# Patient Record
Sex: Male | Born: 1990 | Race: White | Hispanic: No | Marital: Married
Health system: Southern US, Community
[De-identification: ages and names within clinical notes are randomized; demographics above are authoritative.]

---
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
## 2014-02-10 ENCOUNTER — Emergency Department: Payer: Self-pay | Admitting: Emergency Medicine

## 2014-03-19 ENCOUNTER — Emergency Department: Payer: Self-pay | Admitting: Emergency Medicine

## 2014-08-16 ENCOUNTER — Emergency Department: Payer: Self-pay | Admitting: Emergency Medicine

## 2016-04-09 ENCOUNTER — Emergency Department
Admission: EM | Admit: 2016-04-09 | Discharge: 2016-04-09 | Disposition: A | Discharge status: Home / Self Care | Payer: Self-pay | Attending: Emergency Medicine | Admitting: Emergency Medicine

## 2016-04-09 ENCOUNTER — Emergency Department: Payer: Self-pay

## 2016-04-09 DIAGNOSIS — S0181XA Laceration without foreign body of other part of head, initial encounter: Secondary | ICD-10-CM

## 2016-04-09 DIAGNOSIS — F10120 Alcohol abuse with intoxication, uncomplicated: Secondary | ICD-10-CM | POA: Insufficient documentation

## 2016-04-09 DIAGNOSIS — S01112A Laceration without foreign body of left eyelid and periocular area, initial encounter: Secondary | ICD-10-CM | POA: Insufficient documentation

## 2016-04-09 DIAGNOSIS — Y999 Unspecified external cause status: Secondary | ICD-10-CM | POA: Insufficient documentation

## 2016-04-09 DIAGNOSIS — R52 Pain, unspecified: Secondary | ICD-10-CM

## 2016-04-09 DIAGNOSIS — Y9389 Activity, other specified: Secondary | ICD-10-CM | POA: Insufficient documentation

## 2016-04-09 DIAGNOSIS — F10920 Alcohol use, unspecified with intoxication, uncomplicated: Secondary | ICD-10-CM

## 2016-04-09 DIAGNOSIS — W1809XA Striking against other object with subsequent fall, initial encounter: Secondary | ICD-10-CM | POA: Insufficient documentation

## 2016-04-09 DIAGNOSIS — Y929 Unspecified place or not applicable: Secondary | ICD-10-CM | POA: Insufficient documentation

## 2016-04-09 MED ORDER — LIDOCAINE-EPINEPHRINE (PF) 1 %-1:200000 IJ SOLN
20.0000 mL | Freq: Once | INTRAMUSCULAR | Status: DC
  Filled 2016-04-09: qty 30

## 2016-04-09 NOTE — ED Notes (Signed)
RN to room - family had left. RN placed bed alarm and fall pads.

## 2016-04-09 NOTE — Discharge Instructions (Signed)
The sutures will absorb on their own. Please seek medical attention for any high fevers, chest pain, shortness of breath, change in behavior, persistent vomiting, bloody stool or any other new or concerning symptoms. ° °

## 2016-04-09 NOTE — ED Triage Notes (Signed)
Pt BIB EMS for unresponsiveness, ETOH on board. Pt responsive to painful stimuli only. Pt presents with cuts on head and arms
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
## 2016-04-09 NOTE — ED Notes (Signed)
Patient transported to CT 

## 2018-01-14 ENCOUNTER — Emergency Department
Admission: EM | Admit: 2018-01-14 | Discharge: 2018-01-14 | Disposition: A | Discharge status: Home / Self Care | Payer: Medicaid Other | Attending: Emergency Medicine | Admitting: Emergency Medicine

## 2018-01-14 ENCOUNTER — Emergency Department: Payer: Medicaid Other

## 2018-01-14 DIAGNOSIS — Y998 Other external cause status: Secondary | ICD-10-CM | POA: Insufficient documentation

## 2018-01-14 DIAGNOSIS — Y929 Unspecified place or not applicable: Secondary | ICD-10-CM | POA: Insufficient documentation

## 2018-01-14 DIAGNOSIS — S61511A Laceration without foreign body of right wrist, initial encounter: Secondary | ICD-10-CM | POA: Insufficient documentation

## 2018-01-14 DIAGNOSIS — Y939 Activity, unspecified: Secondary | ICD-10-CM | POA: Diagnosis not present

## 2018-01-14 DIAGNOSIS — S61512A Laceration without foreign body of left wrist, initial encounter: Secondary | ICD-10-CM | POA: Diagnosis not present

## 2018-01-14 DIAGNOSIS — W25XXXA Contact with sharp glass, initial encounter: Secondary | ICD-10-CM | POA: Diagnosis not present

## 2018-01-14 MED ORDER — LIDOCAINE-EPINEPHRINE 2 %-1:100000 IJ SOLN
INTRAMUSCULAR | Status: AC
  Filled 2018-01-14: qty 1

## 2018-01-14 MED ORDER — LIDOCAINE-EPINEPHRINE 1 %-1:100000 IJ SOLN
10.0000 mL | Freq: Once | INTRAMUSCULAR | Status: AC
  Administered 2018-01-14: 10 mL via INTRADERMAL
  Filled 2018-01-14: qty 10

## 2018-01-14 MED ORDER — LIDOCAINE HCL (PF) 1 % IJ SOLN
INTRAMUSCULAR | Status: AC
  Filled 2018-01-14: qty 5
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
